# Patient Record
Sex: Female | Born: 1955 | Race: White | Hispanic: No | Marital: Married
Health system: Southern US, Community
[De-identification: ages and names within clinical notes are randomized; demographics above are authoritative.]

---

## 2021-02-25 ENCOUNTER — Emergency Department: Payer: Medicare Other

## 2021-02-25 ENCOUNTER — Other Ambulatory Visit: Payer: Self-pay

## 2021-02-25 ENCOUNTER — Emergency Department
Admission: EM | Admit: 2021-02-25 | Discharge: 2021-02-25 | Disposition: A | Discharge status: Home / Self Care | Payer: Medicare Other | Attending: Emergency Medicine | Admitting: Emergency Medicine

## 2021-02-25 DIAGNOSIS — R1011 Right upper quadrant pain: Secondary | ICD-10-CM | POA: Diagnosis not present

## 2021-02-25 DIAGNOSIS — R55 Syncope and collapse: Secondary | ICD-10-CM | POA: Diagnosis not present

## 2021-02-25 DIAGNOSIS — R079 Chest pain, unspecified: Secondary | ICD-10-CM | POA: Diagnosis present

## 2021-02-25 DIAGNOSIS — J189 Pneumonia, unspecified organism: Secondary | ICD-10-CM | POA: Insufficient documentation

## 2021-02-25 DIAGNOSIS — Z20822 Contact with and (suspected) exposure to covid-19: Secondary | ICD-10-CM | POA: Diagnosis not present

## 2021-02-25 DIAGNOSIS — R112 Nausea with vomiting, unspecified: Secondary | ICD-10-CM

## 2021-02-25 DIAGNOSIS — R1110 Vomiting, unspecified: Secondary | ICD-10-CM

## 2021-02-25 LAB — COMPREHENSIVE METABOLIC PANEL
ALT: 22 U/L (ref 0–44)
AST: 26 U/L (ref 15–41)
Albumin: 3.4 g/dL — ABNORMAL LOW (ref 3.5–5.0)
Alkaline Phosphatase: 60 U/L (ref 38–126)
Anion gap: 8 (ref 5–15)
BUN: 18 mg/dL (ref 8–23)
CO2: 23 mmol/L (ref 22–32)
Calcium: 9.1 mg/dL (ref 8.9–10.3)
Chloride: 108 mmol/L (ref 98–111)
Creatinine, Ser: 0.7 mg/dL (ref 0.44–1.00)
GFR, Estimated: >60 mL/min (ref >60)
Glucose, Bld: 145 mg/dL — ABNORMAL HIGH (ref 70–99)
Potassium: 3.7 mmol/L (ref 3.5–5.1)
Sodium: 139 mmol/L (ref 135–145)
Total Bilirubin: 0.9 mg/dL (ref 0.3–1.2)
Total Protein: 7 g/dL (ref 6.5–8.1)

## 2021-02-25 LAB — CBC WITH DIFFERENTIAL/PLATELET
Abs Immature Granulocytes: 0.02 10*3/uL (ref 0.00–0.07)
Basophils Absolute: 0 10*3/uL (ref 0.0–0.1)
Basophils Relative: 1 %
Eosinophils Absolute: 0 10*3/uL (ref 0.0–0.5)
Eosinophils Relative: 1 %
HCT: 40.5 % (ref 36.0–46.0)
Hemoglobin: 13.8 g/dL (ref 12.0–15.0)
Immature Granulocytes: 0 %
Lymphocytes Relative: 11 %
Lymphs Abs: 0.8 10*3/uL (ref 0.7–4.0)
MCH: 32 pg (ref 26.0–34.0)
MCHC: 34.1 g/dL (ref 30.0–36.0)
MCV: 94 fL (ref 80.0–100.0)
Monocytes Absolute: 0.3 10*3/uL (ref 0.1–1.0)
Monocytes Relative: 4 %
Neutro Abs: 6.7 10*3/uL (ref 1.7–7.7)
Neutrophils Relative %: 83 %
Platelets: 210 10*3/uL (ref 150–400)
RBC: 4.31 MIL/uL (ref 3.87–5.11)
RDW: 12.6 % (ref 11.5–15.5)
WBC: 8 10*3/uL (ref 4.0–10.5)
nRBC: 0 % (ref 0.0–0.2)

## 2021-02-25 LAB — RESP PANEL BY RT-PCR (FLU A&B, COVID) ARPGX2
Influenza A by PCR: NEGATIVE
Influenza B by PCR: NEGATIVE
SARS Coronavirus 2 by RT PCR: NEGATIVE

## 2021-02-25 LAB — D-DIMER, QUANTITATIVE: D-Dimer, Quant: 1.01 ug/mL-FEU — ABNORMAL HIGH (ref 0.00–0.50)

## 2021-02-25 LAB — TROPONIN I (HIGH SENSITIVITY): Troponin I (High Sensitivity): <2 ng/L (ref <18)

## 2021-02-25 IMAGING — CT CT ANGIO CHEST
2 of 6 series · 18 of 46 positions shown · IV contrast (APPLIED)
Comparison: None

CLINICAL DATA: Chest pain and nausea since this morning, syncopal
episode with loss of consciousness lasting 30-45 seconds and
associated with incontinence

EXAM:
CT ANGIOGRAPHY CHEST WITH CONTRAST
TECHNIQUE: Multidetector CT imaging of the chest was performed using the
standard protocol during bolus administration of intravenous
contrast. Multiplanar CT image reconstructions and MIPs were
obtained to evaluate the vascular anatomy.
CONTRAST:  75mL OMNIPAQUE IOHEXOL 350 MG/ML SOLN IV

[Series 6: thins · axial · 0.62mm/px · z∈[-747,-451]mm · 16 of 326 slices shown]
[im 15/326  lung]
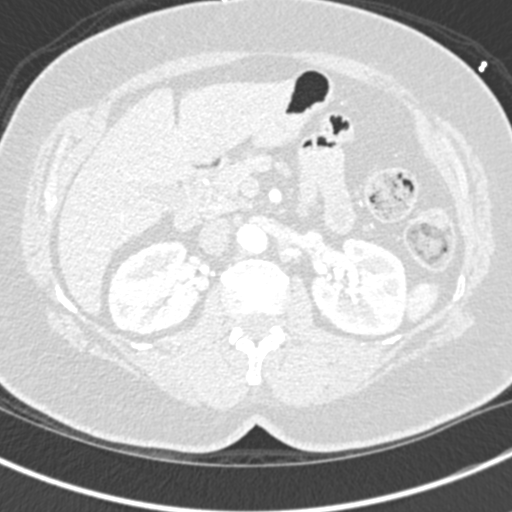
[im 43/326  soft-tissue]
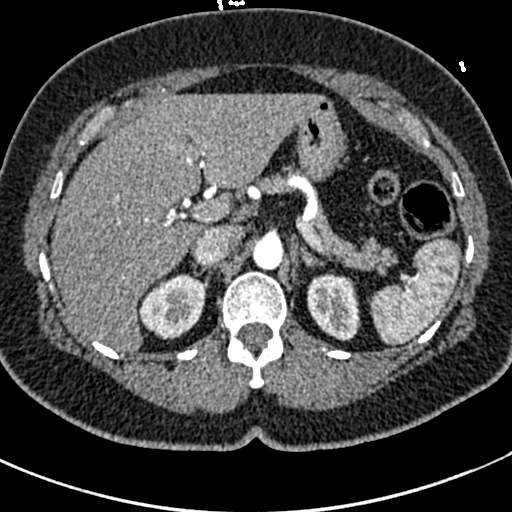
[im 57/326  lung]
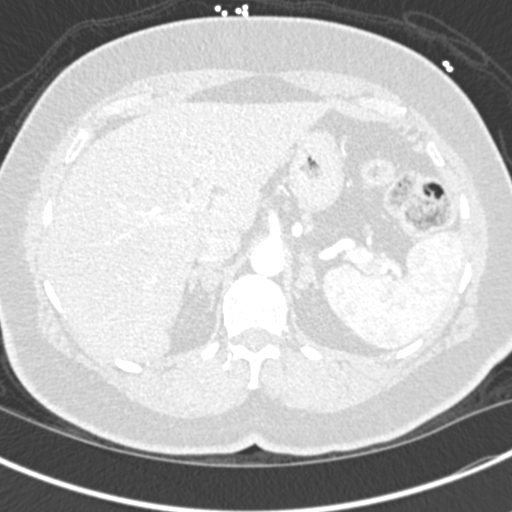
[im 71/326  soft-tissue]
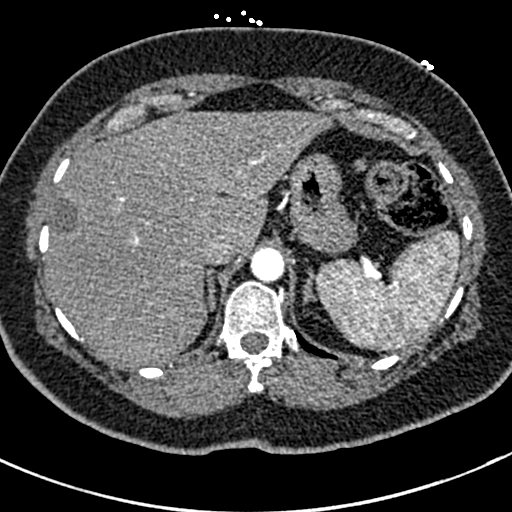
[im 99/326  lung]
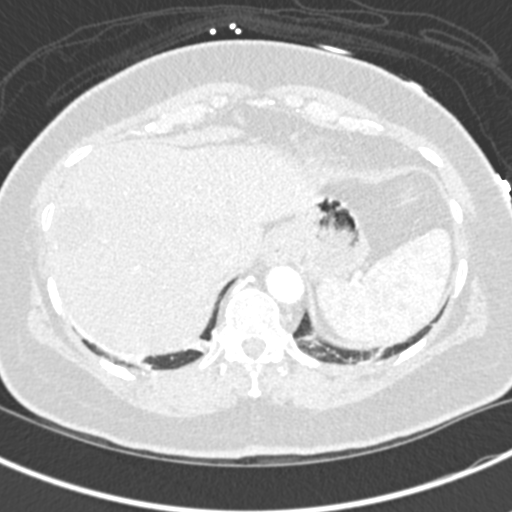
[im 114/326  soft-tissue]
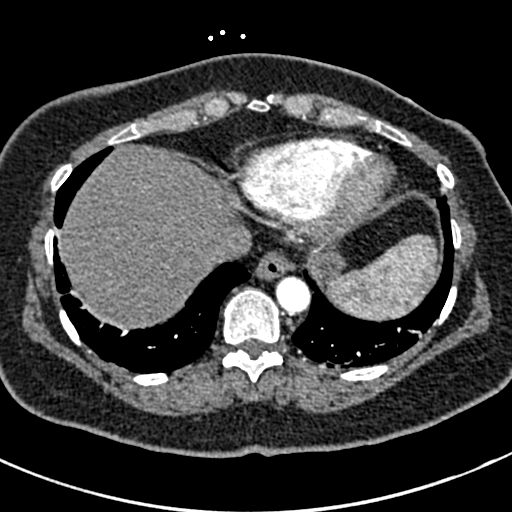
[im 128/326  lung]
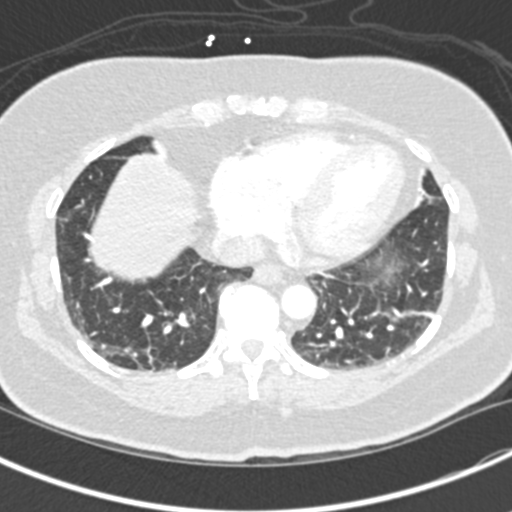
[im 156/326  soft-tissue]
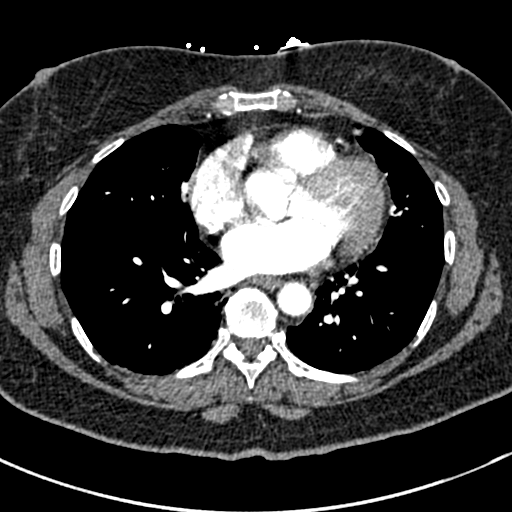
[im 170/326  lung]
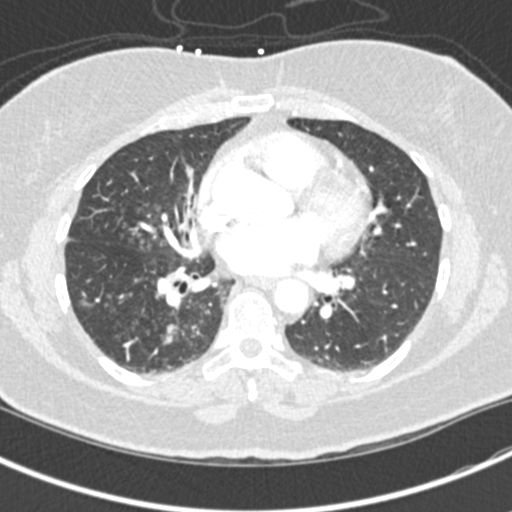
[im 198/326  soft-tissue]
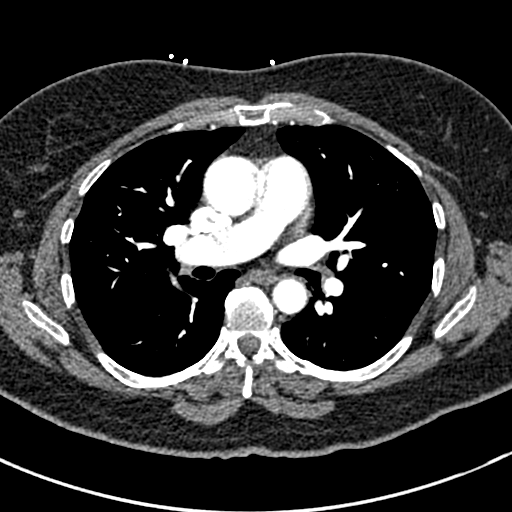
[im 212/326  lung]
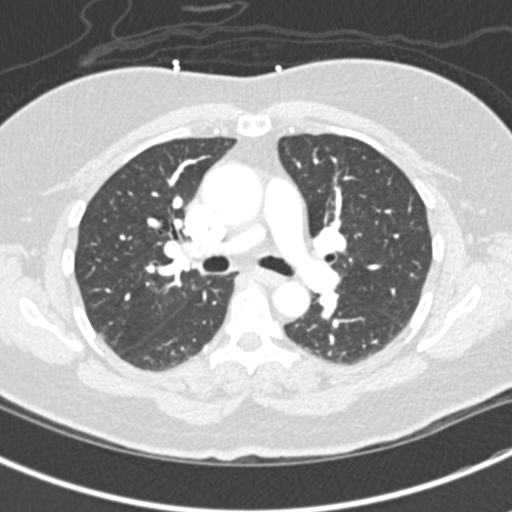
[im 227/326  soft-tissue]
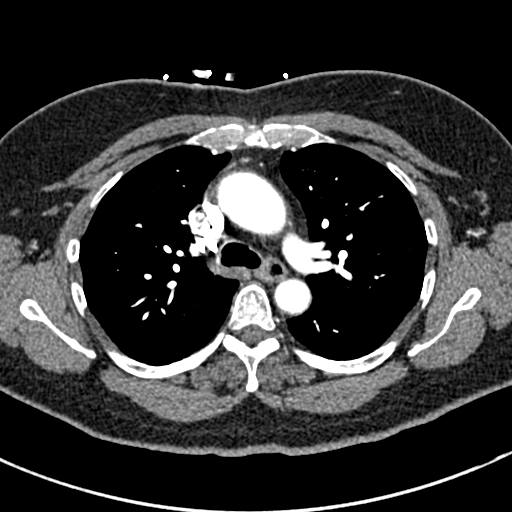
[im 255/326  lung]
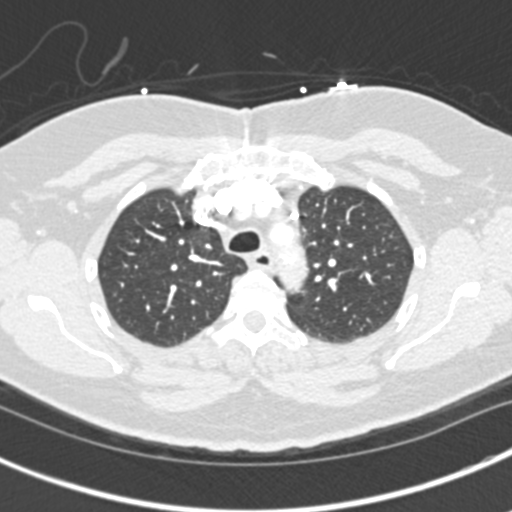
[im 269/326  soft-tissue]
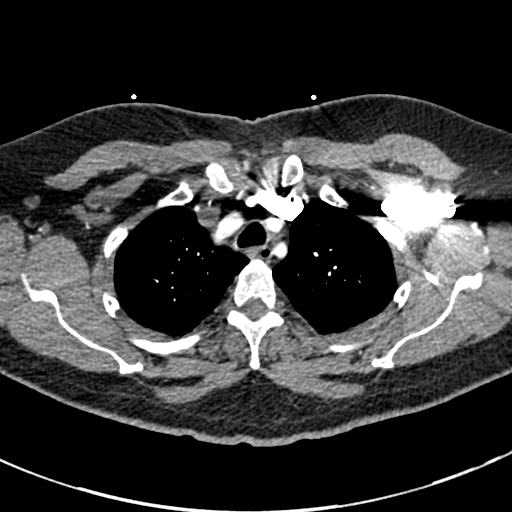
[im 283/326  lung]
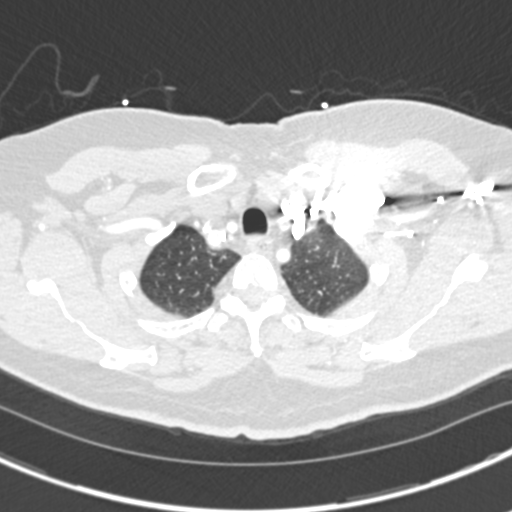
[im 311/326  soft-tissue]
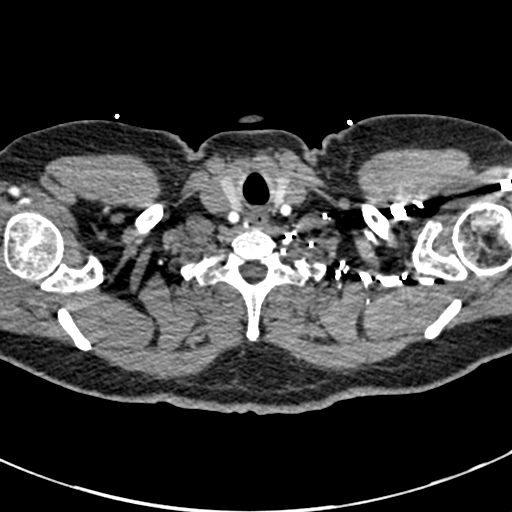

[Series 8: coronal mpr · coronal · 0.61mm/px · 2 of 84 slices shown]
[im 28/84  soft-tissue]
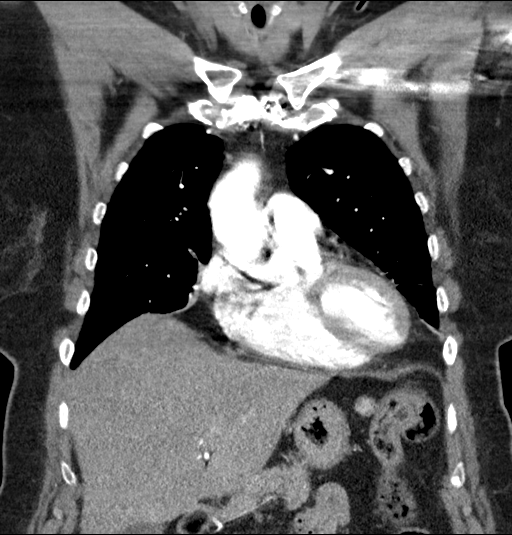
[im 56/84  soft-tissue]
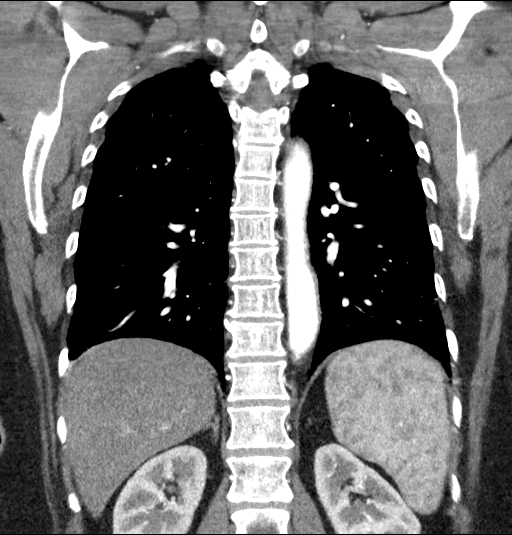

[18 of 46 positions shown; findings below may reference images not displayed]

FINDINGS: Cardiovascular: Atherosclerotic calcifications aorta without
aneurysm or dissection. Heart unremarkable. No pericardial effusion.
Pulmonary arteries well opacified and patent. No evidence of
pulmonary embolism.

Mediastinum/Nodes: Base of cervical region unremarkable. No thoracic
adenopathy. Small hiatal hernia.

Lungs/Pleura: Minimal bibasilar atelectasis. Patchy infiltrates
identified RIGHT middle and RIGHT lower lobes, minimally RIGHT upper
lobe most consistent with pneumonia. No pleural effusion or
pneumothorax.

Upper Abdomen: Cyst RIGHT lobe liver 2.8 x 2.5 cm. Soft tissue
nodule adjacent to the splenic flexure of the colon and the anterior
wall the stomach 1.6 x 1.4 cm, enhancement pattern similar to
spleen, favor splenule. Remaining visualized upper abdomen
unremarkable.

Musculoskeletal: No acute osseous findings.

Review of the MIP images confirms the above findings.
IMPRESSION: No evidence of pulmonary embolism.

Patchy infiltrates RIGHT lung greater in RIGHT middle and RIGHT
lower lobes most consistent with pneumonia.

Small hiatal hernia.

RIGHT lobe liver cyst 2.8 x 2.5 cm.

Soft tissue nodule adjacent to the splenic flexure of the colon and
the anterior wall the stomach 1.6 x 1.4 cm, enhancement pattern
similar to spleen, favor splenule.

Aortic Atherosclerosis ([1Z]-[1Z]).

## 2021-02-25 MED ORDER — IOHEXOL 350 MG/ML SOLN
75.0000 mL | Freq: Once | INTRAVENOUS | Status: AC | PRN
  Administered 2021-02-25: 75 mL via INTRAVENOUS

## 2021-02-25 MED ORDER — DOXYCYCLINE HYCLATE 100 MG PO CAPS: 100.0000 mg | ORAL_CAPSULE | Freq: Two times a day (BID) | ORAL | 0 refills | Status: AC

## 2021-02-25 MED ORDER — ONDANSETRON HCL 4 MG PO TABS: 4.0000 mg | ORAL_TABLET | Freq: Three times a day (TID) | ORAL | 0 refills | Status: AC | PRN

## 2021-02-25 NOTE — ED Notes (Signed)
Patient is resting comfortably. 

## 2021-02-25 NOTE — ED Provider Notes (Signed)
[facility] Emergency Department Provider Note  ____________________________________________   Event Date/Time   First MD Initiated Contact with Patient 02/25/21 [phone]     (approximate)  I have reviewed the triage vital signs and the nursing notes.   HISTORY  Chief Complaint Chest Pain   HPI Kristen Brady is a 65 y.o. female without significant past medical history who presents via EMS from home for assessment of some chest pain and nausea associate with syncopal episode that occurred earlier this morning.  Patient states he woke up feeling nauseous and passed out while in bed for about 30 to 45 seconds which was witnessed by her husband.  She had a little incontinence with this but no shaking or tongue biting.  She has had some chest pressure since.  She also had 1 episode of nonbloody nonbilious vomiting on EMS arrival to the ED.  She denies any fevers but states that after vomiting she has been coughing a little bit.  No other recent cough, shortness of breath, headache, earache, sore throat, back pain, urinary symptoms, diarrhea constipation or rash.  She states she has had a little pain in her right lower quadrant week ago has since resolved.         No past medical history on file.  Patient Active Problem List   Diagnosis Date Noted   Non-intractable vomiting 02/25/2021     Prior to Admission medications   Medication Sig Start Date End Date Taking? Authorizing Provider  doxycycline (VIBRAMYCIN) 100 MG capsule Take 1 capsule (100 mg total) by mouth 2 (two) times daily for 7 days. 02/25/21 03/04/21 Yes Gilles Chiquito, MD  ondansetron (ZOFRAN) 4 MG tablet Take 1 tablet (4 mg total) by mouth every 8 (eight) hours as needed for up to 10 doses for nausea or vomiting. 02/25/21  Yes Gilles Chiquito, MD    Allergies Patient has no known allergies.  No family history on file.  Social History    Review of Systems  Review of Systems   Constitutional:  Negative for chills and fever.  HENT:  Negative for sore throat.   Eyes:  Negative for pain.  Respiratory:  Positive for cough. Negative for stridor.   Cardiovascular:  Positive for chest pain and palpitations.  Gastrointestinal:  Positive for abdominal pain (intermittent over past week although none today), nausea and vomiting. Negative for constipation and diarrhea.  Musculoskeletal:  Negative for myalgias.  Skin:  Negative for rash.  Neurological:  Positive for loss of consciousness. Negative for seizures and headaches.  Psychiatric/Behavioral:  Negative for suicidal ideas.   All other systems reviewed and are negative.    ____________________________________________   PHYSICAL EXAM:  VITAL SIGNS: ED Triage Vitals [02/25/21 0810]  Enc Vitals Group     BP (!) 158/86     Pulse Rate 89     Resp 18     Temp      Temp src      SpO2 95 %     Weight      Height      Head Circumference      Peak Flow      Pain Score      Pain Loc      Pain Edu?      Excl. in GC?    Vitals:   02/25/21 1245 02/25/21 1345  BP:  (!) 126/92  Pulse: (!) 104 98  Resp: 17 16  Temp:    SpO2: 93% 94%  Physical Exam Vitals and nursing note reviewed.  Constitutional:      General: She is not in acute distress.    Appearance: She is well-developed.  HENT:     Head: Normocephalic and atraumatic.     Right Ear: External ear normal.     Left Ear: External ear normal.     Nose: Nose normal.     Mouth/Throat:     Mouth: Mucous membranes are moist.  Eyes:     Conjunctiva/sclera: Conjunctivae normal.  Cardiovascular:     Rate and Rhythm: Normal rate and regular rhythm.     Heart sounds: No murmur heard. Pulmonary:     Effort: Pulmonary effort is normal. No respiratory distress.     Breath sounds: Decreased breath sounds present.  Abdominal:     Palpations: Abdomen is soft.     Tenderness: There is no abdominal tenderness.  Musculoskeletal:     Cervical back: Neck supple.      Right lower leg: No edema.     Left lower leg: No edema.  Skin:    General: Skin is warm and dry.     Capillary Refill: Capillary refill takes less than 2 seconds.  Neurological:     General: No focal deficit present.     Mental Status: She is alert.  Psychiatric:        Mood and Affect: Mood normal.     ____________________________________________   LABS (all labs ordered are listed, but only abnormal results are displayed)  Labs Reviewed  COMPREHENSIVE METABOLIC PANEL - Abnormal; Notable for the following components:      Result Value   Glucose, Bld 145 (*)    Albumin 3.4 (*)    All other components within normal limits  D-DIMER, QUANTITATIVE - Abnormal; Notable for the following components:   D-Dimer, Quant 1.01 (*)    All other components within normal limits  RESP PANEL BY RT-PCR (FLU A&B, COVID) ARPGX2  CBC WITH DIFFERENTIAL/PLATELET  TROPONIN I (HIGH SENSITIVITY)  TROPONIN I (HIGH SENSITIVITY)   ____________________________________________  EKG  Sinus rhythm with a ventricular of 88, unremarkable intervals without evidence of clear acute ischemia or significant arrhythmia. ____________________________________________  RADIOLOGY  ED MD interpretation: Chest x-ray has no focal consolidation, large effusion, significant edema, pneumothorax or other clear acute intrathoracic process.  CTA chest has no evidence of PE, pericardial effusion, dissection/evidence of pneumonia.  Right upper quadrant ultrasound shows hepatic cyst without evidence of cholecystitis or dilated common bile duct.  Official radiology report(s): DG Chest 2 View  Result Date: 02/25/2021 CLINICAL DATA:  Chest pain. EXAM: CHEST - 2 VIEW COMPARISON:  None. FINDINGS: The heart size and mediastinal contours are within normal limits. No pneumothorax or pleural effusion is noted. Mild bibasilar subsegmental atelectasis or scarring is noted. The visualized skeletal structures are unremarkable.  IMPRESSION: Mild bibasilar subsegmental atelectasis or scarring. Aortic Atherosclerosis (ICD10-I70.0). Electronically Signed   By: Lupita Raider M.D.   On: 02/25/2021 09:08   CT Angio Chest PE W and/or Wo Contrast  Result Date: 02/25/2021 CLINICAL DATA:  Chest pain and nausea since this morning, syncopal episode with loss of consciousness lasting 30-45 seconds and associated with incontinence EXAM: CT ANGIOGRAPHY CHEST WITH CONTRAST TECHNIQUE: Multidetector CT imaging of the chest was performed using the standard protocol during bolus administration of intravenous contrast. Multiplanar CT image reconstructions and MIPs were obtained to evaluate the vascular anatomy. CONTRAST:  1mL OMNIPAQUE IOHEXOL 350 MG/ML SOLN IV COMPARISON:  None FINDINGS: Cardiovascular: Atherosclerotic calcifications  aorta without aneurysm or dissection. Heart unremarkable. No pericardial effusion. Pulmonary arteries well opacified and patent. No evidence of pulmonary embolism. Mediastinum/Nodes: Base of cervical region unremarkable. No thoracic adenopathy. Small hiatal hernia. Lungs/Pleura: Minimal bibasilar atelectasis. Patchy infiltrates identified RIGHT middle and RIGHT lower lobes, minimally RIGHT upper lobe most consistent with pneumonia. No pleural effusion or pneumothorax. Upper Abdomen: Cyst RIGHT lobe liver 2.8 x 2.5 cm. Soft tissue nodule adjacent to the splenic flexure of the colon and the anterior wall the stomach 1.6 x 1.4 cm, enhancement pattern similar to spleen, favor splenule. Remaining visualized upper abdomen unremarkable. Musculoskeletal: No acute osseous findings. Review of the MIP images confirms the above findings. IMPRESSION: No evidence of pulmonary embolism. Patchy infiltrates RIGHT lung greater in RIGHT middle and RIGHT lower lobes most consistent with pneumonia. Small hiatal hernia. RIGHT lobe liver cyst 2.8 x 2.5 cm. Soft tissue nodule adjacent to the splenic flexure of the colon and the anterior wall the  stomach 1.6 x 1.4 cm, enhancement pattern similar to spleen, favor splenule. Aortic Atherosclerosis (ICD10-I70.0). Electronically Signed   By: Ulyses Southward M.D.   On: 02/25/2021 10:44   US ABDOMEN LIMITED RUQ (LIVER/GB)  Result Date: 02/25/2021 CLINICAL DATA:  Nausea and chest pain. EXAM: ULTRASOUND ABDOMEN LIMITED RIGHT UPPER QUADRANT COMPARISON:  CT of the chest of February 25, 2021. FINDINGS: Gallbladder: No gallstones or wall thickening visualized. No sonographic Murphy sign noted by sonographer. Common bile duct: Diameter: 2.3 mm Liver: Largely anechoic cystic structure in the RIGHT hepatic lobe peripherally with lobulated contours measuring 3.9 x 2.1 x 2.4 cm, this shows in internal septation, thin septation. No internal flow. Hepatic steatosis. Portal vein is patent on color Doppler imaging with normal direction of blood flow towards the liver. Other: None. IMPRESSION: No acute biliary process. Hepatic steatosis and lobulated septated hepatic cyst. Electronically Signed   By: Donzetta Kohut M.D.   On: 02/25/2021 12:14    ____________________________________________   PROCEDURES  Procedure(s) performed (including Critical Care):  .1-3 Lead EKG Interpretation  Date/Time: 02/25/2021 3:41 PM Performed by: Gilles Chiquito, MD Authorized by: Gilles Chiquito, MD     Interpretation: normal     ECG rate assessment: normal     Rhythm: sinus rhythm     Ectopy: none     Conduction: normal     ____________________________________________   INITIAL IMPRESSION / ASSESSMENT AND PLAN / ED COURSE      Patient presents with above-stated history exam for assessment of some nausea and chest pressure associate with syncopal episode and episode of vomiting started this morning.  On arrival she is hypertensive with BP of 158/86 with otherwise stable vital signs on room air.  Primary differential includes ACS, arrhythmia, PE, anemia, metabolic derangements, pneumonia, cholecystitis, pancreatitis,  possible infectious gastritis.  D-dimer is elevated just above 1 and so we will order CTA chest .  Chest x-ray shows no evidence of pneumonia, acute heart failure, pneumothorax, effusion or other clear acute thoracic process.  CBC has no leukocytosis or acute anemia.  CMP is unremarkable.  No evidence of hepatitis or cholestasis.  Initial troponin is undetectable.  Given patient denies any chest pain with otherwise reassuring EKG Evalose patient for ACS.  CTA without evidence of PE, pericardial effusion, infection with findings consistent with pneumonia.  Certainly possible patient had a vasovagal response to this versus some reflux which he states he has history of this.  He also felt that she has gastritis.  She declined antiemetics x2.  COVID and flu is negative.  He has nonfocal exam and elicitation for CVA and did not like she is septic or significantly dehydrated.  On several reassessment patient voiced significant concern regarding her right upper quadrant intermittent discomfort over the last week.  She states she is very worried about her gallbladder.  Seen and possible this is related to some gastritis as there ultrasound shows no evidence of cholecystitis, gallstones, dilated common bile duct.  Advised her of incidental findings of cyst which can follow-up with her PCP.  Given stable vitals with eyes reassuring exam and work-up and less patient for immediate life-threatening pathology I think she is stable for discharge with close outpatient follow-up.  Rx written for Zofran and doxycycline to cover for commune acquired pneumonia.  Advised patient to stay adequately hydrated.  Discharge stable condition.  Strict turn precautions advised and discussed.     ____________________________________________   FINAL CLINICAL IMPRESSION(S) / ED DIAGNOSES  Final diagnoses:  RUQ pain  Syncope, unspecified syncope type  Pneumonia due to infectious organism, unspecified laterality, unspecified part  of lung  Non-intractable vomiting with nausea, unspecified vomiting type    Medications  iohexol (OMNIPAQUE) 350 MG/ML injection 75 mL (75 mLs Intravenous Contrast Given 02/25/21 1002)     ED Discharge Orders          Ordered    ondansetron (ZOFRAN) 4 MG tablet  Every 8 hours PRN        02/25/21 1245    doxycycline (VIBRAMYCIN) 100 MG capsule  2 times daily        02/25/21 1245             Note:  This document was prepared using Dragon voice recognition software and may include unintentional dictation errors.    Gilles ChiquitoSmith, Shauntell Iglesia P, MD 02/25/21 [phone]

## 2021-02-25 NOTE — ED Triage Notes (Signed)
Pt arrived via [facility] from home with c/o CP and nausea since this AM. Per EMS, pt had syncopal episode with LOC lasting approx. 30-45 seconds with episode of incontinence.    Pt has 1 episode of emesis in ED at this time. Per EMS, last PO intake was 9PM last night.   Pt A&Ox4, able to answer questions.

## 2021-02-25 NOTE — Discharge Instructions (Addendum)
Your Chest CT today showed: No evidence of pulmonary embolism. Patchy infiltrates RIGHT lung greater in RIGHT middle and RIGHT lower lobes most consistent with pneumonia. Small hiatal hernia. RIGHT lobe liver cyst 2.8 x 2.5 cm. Soft tissue nodule adjacent to the splenic flexure of the colon and the anterior wall the stomach 1.6 x 1.4 cm, enhancement pattern similar to spleen, favor splenule. Aortic Atherosclerosis (ICD10-I70.0).
